# Patient Record
Sex: Female | Born: 1995
Health system: Southern US, Community
[De-identification: ages and names within clinical notes are randomized; demographics above are authoritative.]

## PROBLEM LIST (undated history)

## (undated) HISTORY — PX: TONSILLECTOMY: SUR1361

---
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
## 2018-02-03 ENCOUNTER — Encounter (HOSPITAL_BASED_OUTPATIENT_CLINIC_OR_DEPARTMENT_OTHER): Payer: Self-pay

## 2018-02-03 ENCOUNTER — Other Ambulatory Visit: Payer: Self-pay

## 2018-02-03 ENCOUNTER — Emergency Department (HOSPITAL_BASED_OUTPATIENT_CLINIC_OR_DEPARTMENT_OTHER)
Admission: EM | Admit: 2018-02-03 | Discharge: 2018-02-04 | Disposition: A | Discharge status: Home / Self Care | Payer: BLUE CROSS/BLUE SHIELD | Attending: Emergency Medicine | Admitting: Emergency Medicine

## 2018-02-03 DIAGNOSIS — R1012 Left upper quadrant pain: Secondary | ICD-10-CM | POA: Diagnosis present

## 2018-02-03 DIAGNOSIS — N200 Calculus of kidney: Secondary | ICD-10-CM | POA: Diagnosis not present

## 2018-02-03 LAB — PREGNANCY, URINE: Preg Test, Ur: NEGATIVE

## 2018-02-03 NOTE — ED Triage Notes (Signed)
Pt reports left sided back pain that began this AM and has now radiated to left side of abdomen. Pt denies urinary sx, fevers, N/V/D.

## 2018-02-04 ENCOUNTER — Other Ambulatory Visit: Payer: Self-pay

## 2018-02-04 LAB — URINALYSIS, ROUTINE W REFLEX MICROSCOPIC
Bilirubin Urine: NEGATIVE
Glucose, UA: NEGATIVE mg/dL
Ketones, ur: NEGATIVE mg/dL
Leukocytes, UA: NEGATIVE
Nitrite: NEGATIVE
Protein, ur: NEGATIVE mg/dL
Specific Gravity, Urine: 1.015 (ref 1.005–1.030)
pH: 7 (ref 5.0–8.0)

## 2018-02-04 LAB — URINALYSIS, MICROSCOPIC (REFLEX)

## 2018-02-04 MED ORDER — KETOROLAC TROMETHAMINE 15 MG/ML IJ SOLN
15.0000 mg | Freq: Once | INTRAMUSCULAR | Status: AC
  Administered 2018-02-04: 15 mg via INTRAMUSCULAR
  Filled 2018-02-04: qty 1

## 2018-02-04 MED ORDER — ONDANSETRON 4 MG PO TBDP: ORAL_TABLET | ORAL | 0 refills | Status: DC

## 2018-02-04 MED ORDER — MORPHINE SULFATE 15 MG PO TABS: 15.0000 mg | ORAL_TABLET | ORAL | 0 refills | Status: DC | PRN

## 2018-02-04 MED ORDER — TAMSULOSIN HCL 0.4 MG PO CAPS: 0.4000 mg | ORAL_CAPSULE | Freq: Every day | ORAL | 0 refills | Status: DC

## 2018-02-04 NOTE — ED Notes (Signed)
Patient is alert and orientedx4.  Patient was explained discharge instructions and they understood them with no questions.  The patient's mother, Victorino DikeJennifer is driving the patient home.

## 2018-02-04 NOTE — ED Provider Notes (Signed)
[facility] EMERGENCY DEPARTMENT Provider Note   CSN: [id number] Arrival date & time: 02/03/18  2335     History   Chief Complaint Chief Complaint  Patient presents with  . Abdominal Pain    HPI Jeanne Ellis is a 22 y.o. female.  22 yo F with a chief complaint of left flank pain.  This started this evening.  Was sharp and severe.  Nothing seemed to make it better or worse and then it spontaneously improved.  It then recurred a little bit lower down in her back and into the front.  She thought maybe it it improved with massage and Tylenol.  Denies fevers.  Pain suddenly got much worse and she had episode of vomiting and then brought her into the emergency department.  She denies any urinary symptoms denies vaginal bleeding or discharge.  Denies sick contacts.  She denies history of kidney stones but mom had a kidney stone in the past.  The history is provided by the patient.  Abdominal Pain   This is a new problem. The current episode started 3 to 5 hours ago. The problem occurs constantly. The problem has been gradually worsening. The pain is located in the LUQ. The quality of the pain is sharp and shooting. The pain is at a severity of 9/10. The pain is severe. Associated symptoms include nausea and vomiting. Pertinent negatives include fever, dysuria, frequency, headaches, arthralgias and myalgias. Nothing aggravates the symptoms. Nothing relieves the symptoms.    History reviewed. No pertinent past medical history.  There are no active problems to display for this patient.   Past Surgical History:  Procedure Laterality Date  . TONSILLECTOMY       OB History   None      Home Medications    Prior to Admission medications   Medication Sig Start Date End Date Taking? Authorizing Provider  morphine (MSIR) 15 MG tablet Take 1 tablet (15 mg total) by mouth every 4 (four) hours as needed for severe pain. 02/04/18   Melene PlanFloyd, Dyquan Minks, DO  ondansetron (ZOFRAN ODT) 4 MG  disintegrating tablet 4mg  ODT q4 hours prn nausea/vomit 02/04/18   Melene PlanFloyd, Zaeden Lastinger, DO  tamsulosin (FLOMAX) 0.4 MG CAPS capsule Take 1 capsule (0.4 mg total) by mouth daily after supper. 02/04/18   Melene PlanFloyd, Mael Delap, DO    Family History No family history on file.  Social History Social History   Tobacco Use  . Smoking status: Never Smoker  . Smokeless tobacco: Never Used  Substance Use Topics  . Alcohol use: Yes    Frequency: Never  . Drug use: Never     Allergies   Penicillins   Review of Systems Review of Systems  Constitutional: Negative for chills and fever.  HENT: Negative for congestion and rhinorrhea.   Eyes: Negative for redness and visual disturbance.  Respiratory: Negative for shortness of breath and wheezing.   Cardiovascular: Negative for chest pain and palpitations.  Gastrointestinal: Positive for abdominal pain, nausea and vomiting.  Genitourinary: Positive for flank pain. Negative for dysuria, frequency and urgency.  Musculoskeletal: Negative for arthralgias and myalgias.  Skin: Negative for pallor and wound.  Neurological: Negative for dizziness and headaches.     Physical Exam Updated Vital Signs BP (!) 160/100 (BP Location: Right Arm)   Temp 98.4 F (36.9 C) (Oral)   Resp 18   Ht 5\' 2"  (1.575 m)   Wt 63.5 kg (140 lb)   LMP 01/04/2018   SpO2 99%   BMI 25.61  kg/m   Physical Exam  Constitutional: She is oriented to person, place, and time. She appears well-developed and well-nourished. No distress.  HENT:  Head: Normocephalic and atraumatic.  Eyes: Pupils are equal, round, and reactive to light. EOM are normal.  Neck: Normal range of motion. Neck supple.  Cardiovascular: Normal rate and regular rhythm. Exam reveals no gallop and no friction rub.  No murmur heard. Pulmonary/Chest: Effort normal. She has no wheezes. She has no rales.  Abdominal: Soft. She exhibits no distension and no mass. There is no tenderness. There is no guarding, no CVA tenderness, no  tenderness at McBurney's point and negative Murphy's sign.  Musculoskeletal: She exhibits no edema or tenderness.  Neurological: She is alert and oriented to person, place, and time.  Skin: Skin is warm and dry. She is not diaphoretic.  Psychiatric: She has a normal mood and affect. Her behavior is normal.  Nursing note and vitals reviewed.    ED Treatments / Results  Labs (all labs ordered are listed, but only abnormal results are displayed) Labs Reviewed  URINALYSIS, ROUTINE W REFLEX MICROSCOPIC - Abnormal; Notable for the following components:      Result Value   Hgb urine dipstick MODERATE (*)    All other components within normal limits  URINALYSIS, MICROSCOPIC (REFLEX) - Abnormal; Notable for the following components:   Bacteria, UA MANY (*)    All other components within normal limits  PREGNANCY, URINE    EKG None  Radiology No results found.  Procedures Procedures (including critical care time)  Medications Ordered in ED Medications  ketorolac (TORADOL) 15 MG/ML injection 15 mg (15 mg Intramuscular Given 02/04/18 0018)     Initial Impression / Assessment and Plan / ED Course  I have reviewed the triage vital signs and the nursing notes.  Pertinent labs & imaging results that were available during my care of the patient were reviewed by me and considered in my medical decision making (see chart for details).     22 yo F 1q with a chief complaint of colicky flank pain.  By history this sounds like kidney stones.  Unfortunately I do not have ultrasound at this time.  I discussed risks and benefits of CT imaging with the family and patient currently elects for treatment as if this is a kidney stone.  Hematuria on urine dip. She will follow-up with her family physician.  2:41 AM:  I have discussed the diagnosis/risks/treatment options with the patient and family and believe the pt to be eligible for discharge home to follow-up with PCP. We also discussed returning to  the ED immediately if new or worsening sx occur. We discussed the sx which are most concerning (e.g., sudden worsening pain, fever, inability to tolerate by mouth) that necessitate immediate return. Medications administered to the patient during their visit and any new prescriptions provided to the patient are listed below.  Medications given during this visit Medications  ketorolac (TORADOL) 15 MG/ML injection 15 mg (15 mg Intramuscular Given 02/04/18 0018)      The patient appears reasonably screen and/or stabilized for discharge and I doubt any other medical condition or other [facility] requiring further screening, evaluation, or treatment in the ED at this time prior to discharge.    Final Clinical Impressions(s) / ED Diagnoses   Final diagnoses:  Nephrolithiasis    ED Discharge Orders        Ordered    morphine (MSIR) 15 MG tablet  Every 4 hours PRN  02/04/18 0016    ondansetron (ZOFRAN ODT) 4 MG disintegrating tablet     02/04/18 0016    tamsulosin (FLOMAX) 0.4 MG CAPS capsule  Daily after supper     02/04/18 0016       Melene Plan, DO 02/04/18 [id number]

## 2018-02-04 NOTE — ED Notes (Signed)
ED Provider at bedside. 

## 2018-02-04 NOTE — Discharge Instructions (Addendum)
Take 4 over the counter ibuprofen tablets 3 times a day or 2 over-the-counter naproxen tablets twice a day for pain. Also take tylenol 1000mg (2 extra strength) four times a day.   Then take the pain medicine if you feel like you need it. Narcotics do not help with the pain, they only make you care about it less.  You can become addicted to this, people may break into your house to steal it.  It will constipate you.  If you drive under the influence of this medicine you can get a DUI.    Please follow-up with your family physician.  If this does not pass any reasonable amount of time then he may need to see a urologist.  Please return to the emergency department for fever or uncontrolled pain or vomiting.

## 2020-11-06 ENCOUNTER — Other Ambulatory Visit: Payer: Self-pay

## 2020-11-06 ENCOUNTER — Emergency Department (HOSPITAL_COMMUNITY): Payer: BC Managed Care – PPO

## 2020-11-06 ENCOUNTER — Encounter (HOSPITAL_COMMUNITY): Payer: Self-pay

## 2020-11-06 ENCOUNTER — Emergency Department (HOSPITAL_COMMUNITY)
Admission: EM | Admit: 2020-11-06 | Discharge: 2020-11-06 | Disposition: A | Discharge status: Home / Self Care | Payer: BC Managed Care – PPO | Attending: Emergency Medicine | Admitting: Emergency Medicine

## 2020-11-06 DIAGNOSIS — R109 Unspecified abdominal pain: Secondary | ICD-10-CM | POA: Insufficient documentation

## 2020-11-06 DIAGNOSIS — M549 Dorsalgia, unspecified: Secondary | ICD-10-CM | POA: Diagnosis not present

## 2020-11-06 LAB — URINALYSIS, ROUTINE W REFLEX MICROSCOPIC
Bilirubin Urine: NEGATIVE
Glucose, UA: NEGATIVE mg/dL
Ketones, ur: NEGATIVE mg/dL
Nitrite: NEGATIVE
Protein, ur: NEGATIVE mg/dL
RBC / HPF: >50 RBC/hpf — ABNORMAL HIGH (ref 0–5)
Specific Gravity, Urine: 1.017 (ref 1.005–1.030)
pH: 5 (ref 5.0–8.0)

## 2020-11-06 LAB — COMPREHENSIVE METABOLIC PANEL
ALT: 14 U/L (ref 0–44)
AST: 20 U/L (ref 15–41)
Albumin: 3.6 g/dL (ref 3.5–5.0)
Alkaline Phosphatase: 58 U/L (ref 38–126)
Anion gap: 9 (ref 5–15)
BUN: 11 mg/dL (ref 6–20)
CO2: 19 mmol/L — ABNORMAL LOW (ref 22–32)
Calcium: 8.8 mg/dL — ABNORMAL LOW (ref 8.9–10.3)
Chloride: 109 mmol/L (ref 98–111)
Creatinine, Ser: 0.78 mg/dL (ref 0.44–1.00)
GFR, Estimated: >60 mL/min (ref >60)
Glucose, Bld: 112 mg/dL — ABNORMAL HIGH (ref 70–99)
Potassium: 3.8 mmol/L (ref 3.5–5.1)
Sodium: 137 mmol/L (ref 135–145)
Total Bilirubin: 0.2 mg/dL — ABNORMAL LOW (ref 0.3–1.2)
Total Protein: 7.5 g/dL (ref 6.5–8.1)

## 2020-11-06 LAB — CBC WITH DIFFERENTIAL/PLATELET
Abs Immature Granulocytes: 0.05 10*3/uL (ref 0.00–0.07)
Basophils Absolute: 0 10*3/uL (ref 0.0–0.1)
Basophils Relative: 0 %
Eosinophils Absolute: 0.1 10*3/uL (ref 0.0–0.5)
Eosinophils Relative: 1 %
HCT: 41.3 % (ref 36.0–46.0)
Hemoglobin: 13.5 g/dL (ref 12.0–15.0)
Immature Granulocytes: 0 %
Lymphocytes Relative: 16 %
Lymphs Abs: 2.1 10*3/uL (ref 0.7–4.0)
MCH: 27.4 pg (ref 26.0–34.0)
MCHC: 32.7 g/dL (ref 30.0–36.0)
MCV: 83.8 fL (ref 80.0–100.0)
Monocytes Absolute: 0.8 10*3/uL (ref 0.1–1.0)
Monocytes Relative: 6 %
Neutro Abs: 10 10*3/uL — ABNORMAL HIGH (ref 1.7–7.7)
Neutrophils Relative %: 77 %
Platelets: 335 10*3/uL (ref 150–400)
RBC: 4.93 MIL/uL (ref 3.87–5.11)
RDW: 13.5 % (ref 11.5–15.5)
WBC: 13.2 10*3/uL — ABNORMAL HIGH (ref 4.0–10.5)
nRBC: 0 % (ref 0.0–0.2)

## 2020-11-06 LAB — I-STAT BETA HCG BLOOD, ED (MC, WL, AP ONLY): I-stat hCG, quantitative: <5 m[IU]/mL (ref <5)

## 2020-11-06 LAB — LIPASE, BLOOD: Lipase: 45 U/L (ref 11–51)

## 2020-11-06 MED ORDER — HYDROCODONE-ACETAMINOPHEN 5-325 MG PO TABS
1.0000 | ORAL_TABLET | Freq: Once | ORAL | Status: AC
  Administered 2020-11-06: 1 via ORAL
  Filled 2020-11-06: qty 1

## 2020-11-06 MED ORDER — KETOROLAC TROMETHAMINE 60 MG/2ML IM SOLN
60.0000 mg | Freq: Once | INTRAMUSCULAR | Status: AC
  Administered 2020-11-06: 60 mg via INTRAMUSCULAR
  Filled 2020-11-06: qty 2

## 2020-11-06 NOTE — ED Provider Notes (Signed)
MSE was initiated and I personally evaluated the patient and placed orders (if any) at  6:21 AM on November 06, 2020.  Woke this morning due to right abdominal and flank pain. Initially had nausea that has resolved. No fever. Pain is right abdomen through to the flank.No history of same. No urinary symptoms, vaginal symptoms, cough, chest pain, or sob.   Today's Vitals   11/06/20 0608 11/06/20 0614  BP: 132/86   Pulse: 86   Resp: 18   Temp: 98.1 F (36.7 C)   TempSrc: Oral   SpO2: 100%   Weight: 72.6 kg   Height: 5\' 2"  (1.575 m)   PainSc:  8    Body mass index is 29.26 kg/m.   Tender RUQ and right flank.    The patient appears stable so that the remainder of the MSE may be completed by another provider.   , PA-C 11/06/20 01/06/21    [id number], MD 11/07/20 [phone]

## 2020-11-06 NOTE — ED Provider Notes (Signed)
[facility]-EMERGENCY DEPT Provider Note   CSN: [id number] Arrival date & time: 11/06/20  0601     History Chief Complaint  Patient presents with  . Flank Pain    Jeanne Freezeaylor Ellis is a 25 y.o. female.  HPI Patient is a 25 year old female who presents the emergency department due to abdominal pain and back pain.  She states she woke up about 2.5 hours ago with excruciating pain in the right abdomen and flank.  She reports mild nausea at the time which is since resolved.  No vomiting.  No fevers, chills, urinary complaints.  She does note a history of kidney stones in the past that were on the left side.  She was given a Vicodin in triage and states her current pain has mostly resolved and is now is 3/10.    History reviewed. No pertinent past medical history.  There are no problems to display for this patient.   Past Surgical History:  Procedure Laterality Date  . TONSILLECTOMY       OB History   No obstetric history on file.     No family history on file.  Social History   Tobacco Use  . Smoking status: Never Smoker  . Smokeless tobacco: Never Used  Substance Use Topics  . Alcohol use: Yes  . Drug use: Never    Home Medications Prior to Admission medications   Medication Sig Start Date End Date Taking? Authorizing Provider  cetirizine (ZYRTEC) 10 MG tablet Take 10 mg by mouth daily as needed for allergies.   Yes [provider]  JUNEL FE 1/20 1-20 MG-MCG tablet Take 1 tablet by mouth daily. 11/04/20  Yes [provider]  sertraline (ZOLOFT) 25 MG tablet Take 25 mg by mouth daily. Take with 50mg  tablet 08/29/20  Yes [provider]  sertraline (ZOLOFT) 50 MG tablet Take 50 mg by mouth daily. Take with 25mg  tablet 08/31/20  Yes [provider]    Allergies    Penicillins and Tape  Review of Systems   Review of Systems  All other systems reviewed and are negative. Ten systems reviewed and are negative for acute  change, except as noted in the HPI.   Physical Exam Updated Vital Signs BP 127/84   Pulse 71   Temp 98.1 F (36.7 C) (Oral)   Resp 16   Ht 5\' 2"  (1.575 m)   Wt 72.6 kg   SpO2 96%   BMI 29.26 kg/m   Physical Exam Vitals and nursing note reviewed.  Constitutional:      General: She is not in acute distress.    Appearance: Normal appearance. She is normal weight. She is not ill-appearing, toxic-appearing or diaphoretic.  HENT:     Head: Normocephalic and atraumatic.     Right Ear: External ear normal.     Left Ear: External ear normal.     Nose: Nose normal.     Mouth/Throat:     Mouth: Mucous membranes are moist.     Pharynx: Oropharynx is clear. No oropharyngeal exudate or posterior oropharyngeal erythema.  Eyes:     General: No scleral icterus.       Right eye: No discharge.        Left eye: No discharge.     Extraocular Movements: Extraocular movements intact.     Conjunctiva/sclera: Conjunctivae normal.  Cardiovascular:     Rate and Rhythm: Normal rate and regular rhythm.     Pulses: Normal pulses.  Heart sounds: Normal heart sounds. No murmur heard. No friction rub. No gallop.   Pulmonary:     Effort: Pulmonary effort is normal. No respiratory distress.     Breath sounds: Normal breath sounds. No stridor. No wheezing, rhonchi or rales.  Abdominal:     General: Abdomen is flat.     Palpations: Abdomen is soft.     Tenderness: There is abdominal tenderness. There is right CVA tenderness. There is no left CVA tenderness.     Comments: Mild tenderness noted with deep palpation to the right central lateral abdomen.  Additional moderate tenderness noted to the right flank.  No left flank pain.  Musculoskeletal:        General: Normal range of motion.     Cervical back: Normal range of motion and neck supple. No tenderness.  Skin:    General: Skin is warm and dry.  Neurological:     General: No focal deficit present.     Mental Status: She is alert and oriented to  person, place, and time.  Psychiatric:        Mood and Affect: Mood normal.        Behavior: Behavior normal.    ED Results / Procedures / Treatments   Labs (all labs ordered are listed, but only abnormal results are displayed) Labs Reviewed  URINALYSIS, ROUTINE W REFLEX MICROSCOPIC - Abnormal; Notable for the following components:      Result Value   Hgb urine dipstick LARGE (*)    Leukocytes,Ua TRACE (*)    RBC / HPF >50 (*)    Bacteria, UA FEW (*)    All other components within normal limits  CBC WITH DIFFERENTIAL/PLATELET - Abnormal; Notable for the following components:   WBC 13.2 (*)    Neutro Abs 10.0 (*)    All other components within normal limits  COMPREHENSIVE METABOLIC PANEL - Abnormal; Notable for the following components:   CO2 19 (*)    Glucose, Bld 112 (*)    Calcium 8.8 (*)    Total Bilirubin 0.2 (*)    All other components within normal limits  LIPASE, BLOOD  I-STAT BETA HCG BLOOD, ED ([facility], [facility], [facility] ONLY)    EKG None  Radiology US Abdomen Limited  Result Date: 11/06/2020 CLINICAL DATA:  Right upper quadrant pain, nausea EXAM: ULTRASOUND ABDOMEN LIMITED RIGHT UPPER QUADRANT COMPARISON:  None. FINDINGS: Gallbladder: No gallstones or wall thickening visualized. No sonographic Murphy sign noted by sonographer. Common bile duct: Diameter: Normal at 3 mm Liver: No focal lesion identified. Within normal limits in parenchymal echogenicity. Portal vein is patent on color Doppler imaging with normal direction of blood flow towards the liver. Other: None. IMPRESSION: Normal right upper quadrant ultrasound. Electronically Signed   By: Malachy Moan M.D.   On: 11/06/2020 07:13   CT Renal Stone Study  Result Date: 11/06/2020 CLINICAL DATA:  Right flank pain EXAM: CT ABDOMEN AND PELVIS WITHOUT CONTRAST TECHNIQUE: Multidetector CT imaging of the abdomen and pelvis was performed following the standard protocol without oral or IV contrast. COMPARISON:  None. FINDINGS: Lower  chest: There is slight bibasilar atelectasis. No lung base edema or consolidation. Hepatobiliary: No focal liver lesions are appreciable on this noncontrast enhanced study. Gallbladder wall is not appreciably thickened. There is no biliary duct dilatation. Pancreas: There is no pancreatic mass or inflammatory focus. Spleen: No splenic lesions are evident. There is a small splenule inferior to the spleen. Adrenals/Urinary Tract: Adrenals bilaterally appear normal. There is no  appreciable mass or hydronephrosis on either side. There is a 1 mm calculus in the lower pole the left kidney. 1 mm calculus noted in upper to mid right kidney with a tiny calculus in the lower pole the right kidney as well. There is no appreciable ureteral calculus on either side. Urinary bladder is midline with wall thickness within normal limits. Stomach/Bowel: There is no appreciable bowel wall or mesenteric thickening. There is moderate stool in the colon. No evident bowel obstruction. The terminal ileum appears normal. Note that the succumb is located in the inferior right pelvis. Appendix appears normal. No evident free air or portal venous air. Vascular/Lymphatic: No abdominal aortic aneurysm. No vascular lesions evident on noncontrast enhanced study. There is no evident adenopathy in the abdomen or pelvis. Reproductive: The uterus is anteverted.  No evident adnexal mass. Other: No ascites or abscess evident in the abdomen or pelvis. There is slight fat in the umbilicus. Musculoskeletal: There is a total hip replacement on the left. No blastic or lytic bone lesions. No intramuscular lesions. IMPRESSION: 1. 1 mm calculus in each kidney. No hydronephrosis or ureteral calculus on either side. Urinary bladder wall thickness normal. 2. No bowel wall thickening or bowel obstruction. No abscess in the abdomen pelvis. Appendix appears normal. 3.  Total hip prosthesis on the left. Electronically Signed   By: Bretta Bang III M.D.   On:  11/06/2020 09:15    Procedures Procedures   Medications Ordered in ED Medications  ketorolac (TORADOL) injection 60 mg (has no administration in time range)  HYDROcodone-acetaminophen (NORCO/VICODIN) 5-325 MG per tablet 1 tablet (1 tablet Oral Given 11/06/20 0254)    ED Course  I have reviewed the triage vital signs and the nursing notes.  Pertinent labs & imaging results that were available during my care of the patient were reviewed by me and considered in my medical decision making (see chart for details).    MDM Rules/Calculators/A&P                          Pt is a 25 y.o. female who presents to the ED with right abdominal pain and right flank pain.  Labs: CBC with a white count of 13.2 and neutrophils of 10. CMP with a CO2 of 19, glucose of 112, calcium of 8.8, total bilirubin of 0.2. Lipase of 45. I-STAT beta-hCG less than 5. UA with large hemoglobin, trace leukocytes, greater than 50 red blood cells, few bacteria.  Imaging: Right upper quadrant ultrasound is negative. CT scan of the abdomen and pelvis without contrast shows a 1 mm calculus in each kidney without hydronephrosis or ureteral calculus.  I, Placido Sou, PA-C, personally reviewed and evaluated these images and lab results as part of my medical decision-making.  Feel the patient likely passed a kidney stone this morning.  She has a history of kidney stones and UA showing greater than 50 red blood cells as well as large hemoglobin.  Imaging is reassuring.  She does have 1 mm stones in both kidneys but no stones noted in the bladder or ureters.  No bladder wall thickening.  Patient has no urinary complaints.  She was given 1 dose of Vicodin in triage and states her pain is almost completely resolved.  She is now lying comfortably in bed.   We will give patient a dose of IM Toradol here in the emergency department.  Recommended continued NSAID use.  Recommended that she continue monitoring her symptoms  and if  they worsen, she needs to return the emergency department immediately for reevaluation.  PCP follow-up.  She verbalized understanding of the above plan.  Her questions were answered and she was amicable to time of discharge.  Note: Portions of this report may have been transcribed using voice recognition software. Every effort was made to ensure accuracy; however, inadvertent computerized transcription errors may be present.   Final Clinical Impression(s) / ED Diagnoses Final diagnoses:  Flank pain   Rx / DC Orders ED Discharge Orders    None       Placido Sou, PA-C 11/06/20 9728    Cathren Laine, MD 11/06/20 1011

## 2020-11-06 NOTE — Discharge Instructions (Addendum)
Please continue taking ibuprofen for management of your pain.  You can take 400 to 600 mg 2-3 times per day.  Try to take this with a small amount of food to help prevent stomach irritation.  Please follow-up with your regular doctor regarding your symptoms.  If you have worsening pain, nausea, vomiting, fevers, chills, please return to the emergency department immediately for reevaluation.  It was a pleasure to meet you.

## 2022-04-26 IMAGING — US US ABDOMEN LIMITED RUQ/ASCITES
1 series · 14 of 25 positions shown · non-contrast
Comparison: None.

CLINICAL DATA: Right upper quadrant pain, nausea

EXAM:
ULTRASOUND ABDOMEN LIMITED RIGHT UPPER QUADRANT

[Series 1: us abdomen limited ruq/ascites · 14 of 86 slices shown]
[im 1/86]
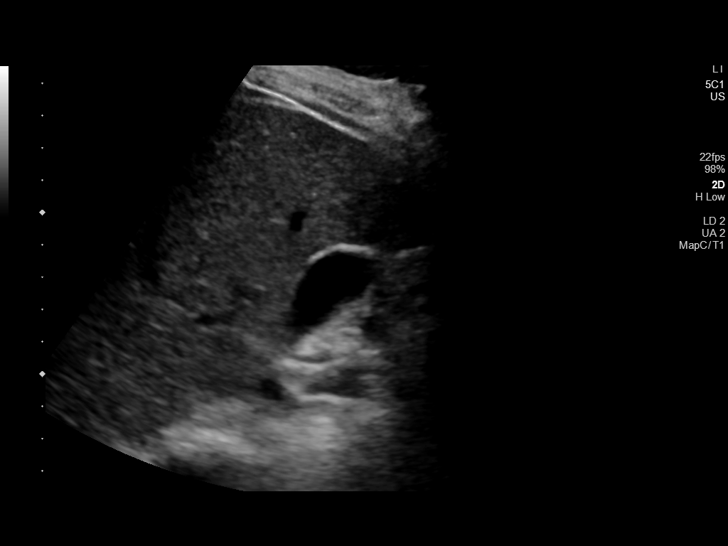
[im 8/86]
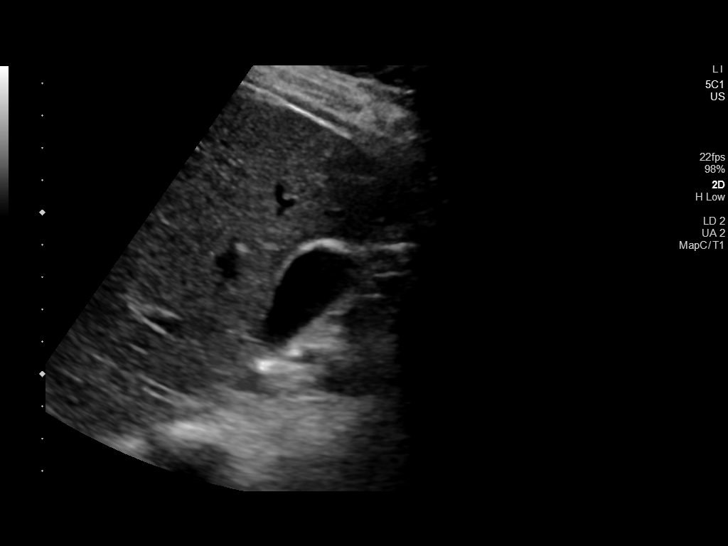
[im 15/86]
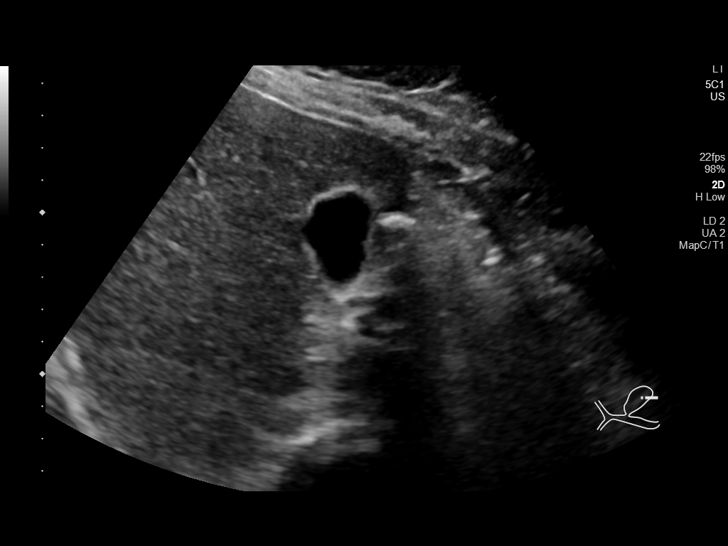
[im 22/86]
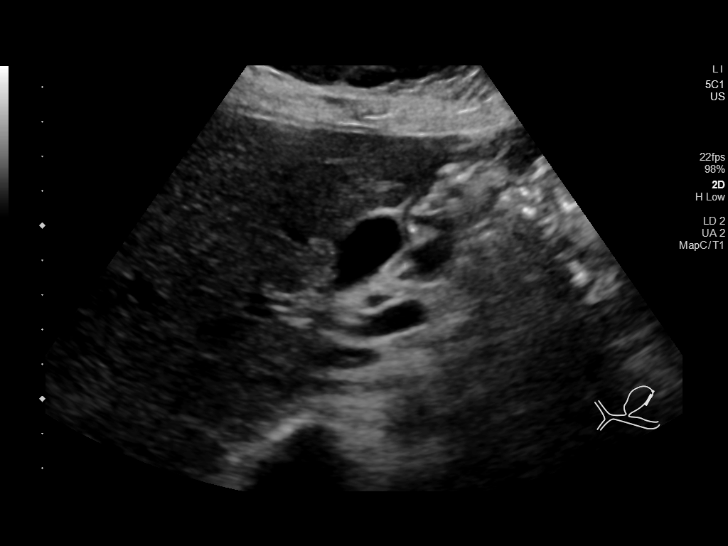
[im 29/86]
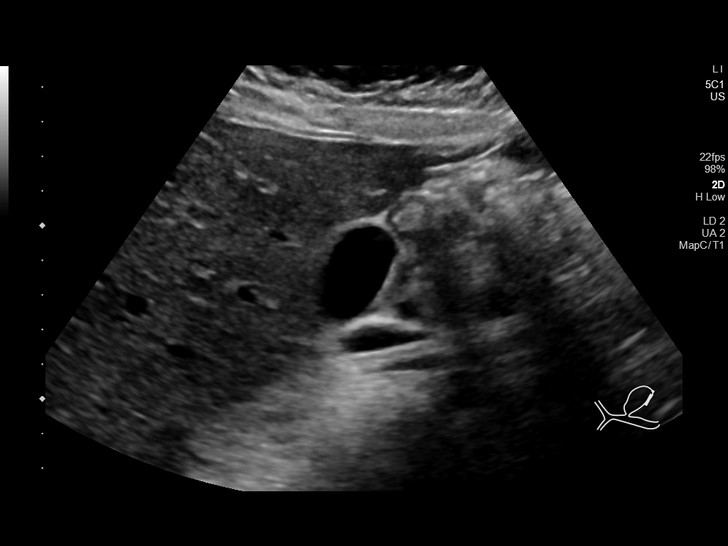
[im 32/86]
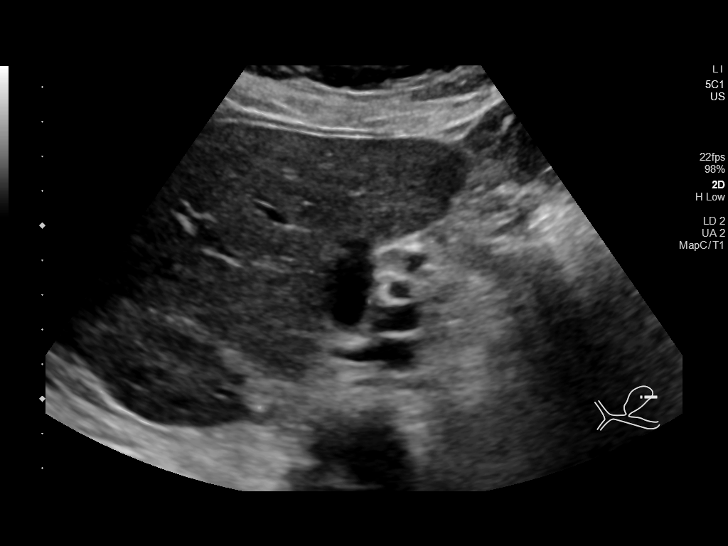
[im 39/86]
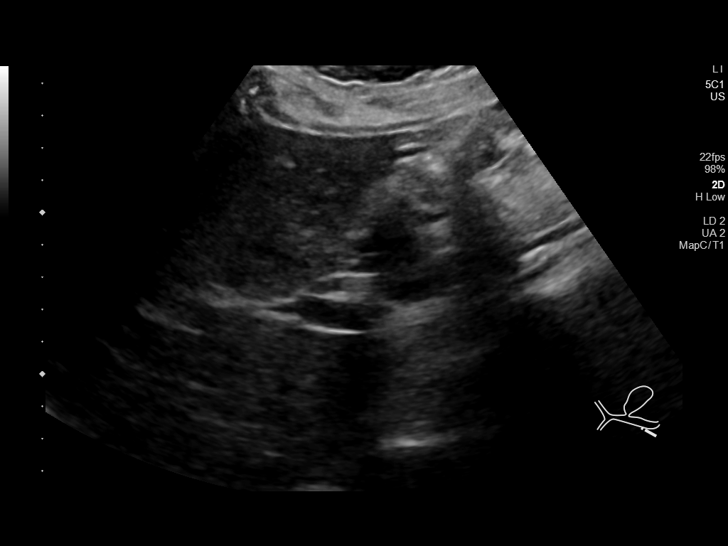
[im 47/86]
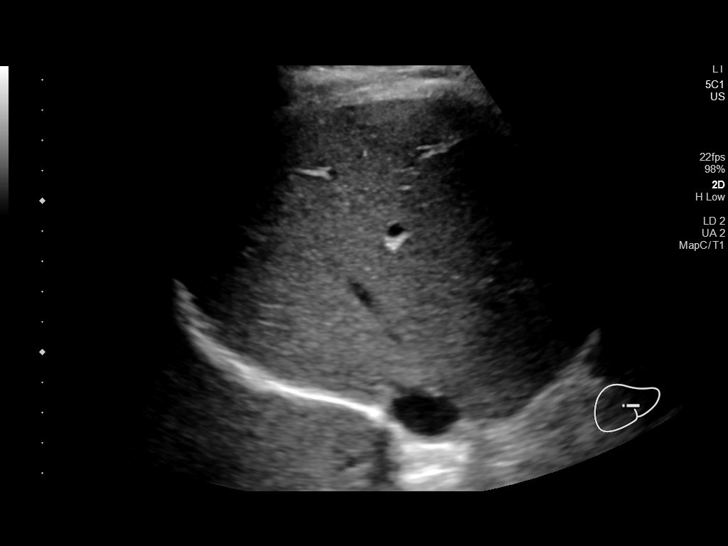
[im 54/86]
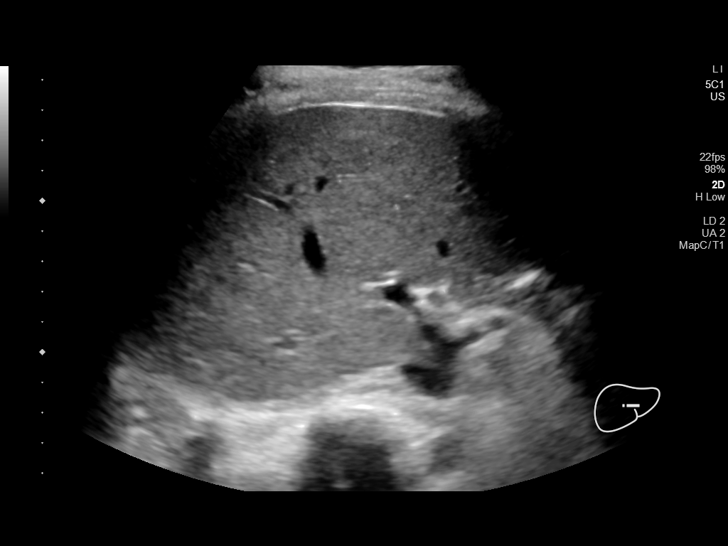
[im 57/86]
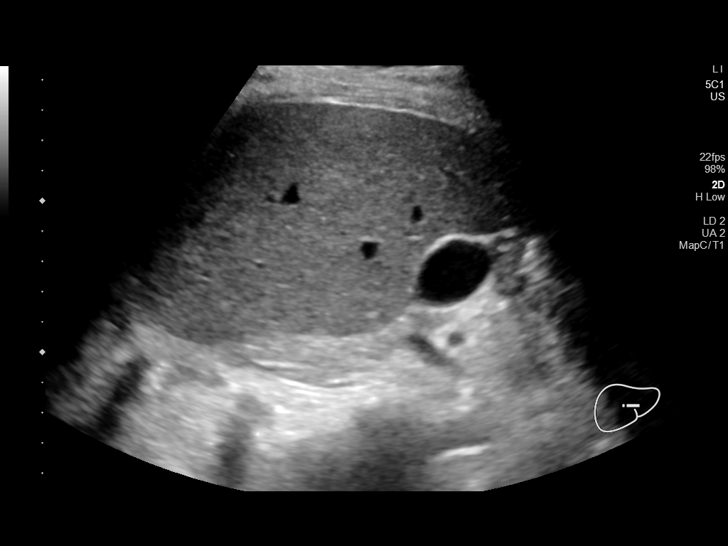
[im 64/86]
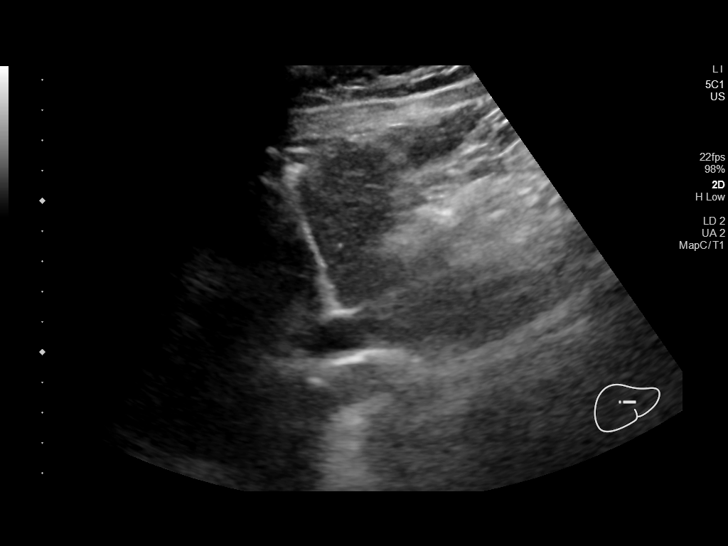
[im 71/86]
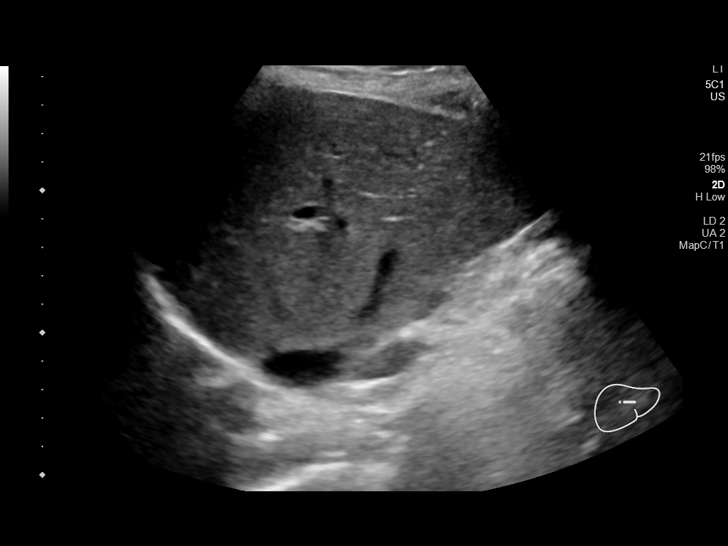
[im 78/86]
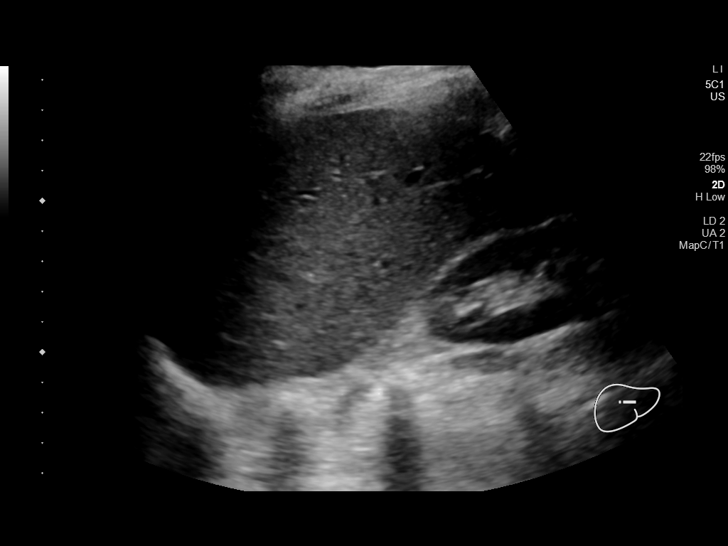
[im 86/86]
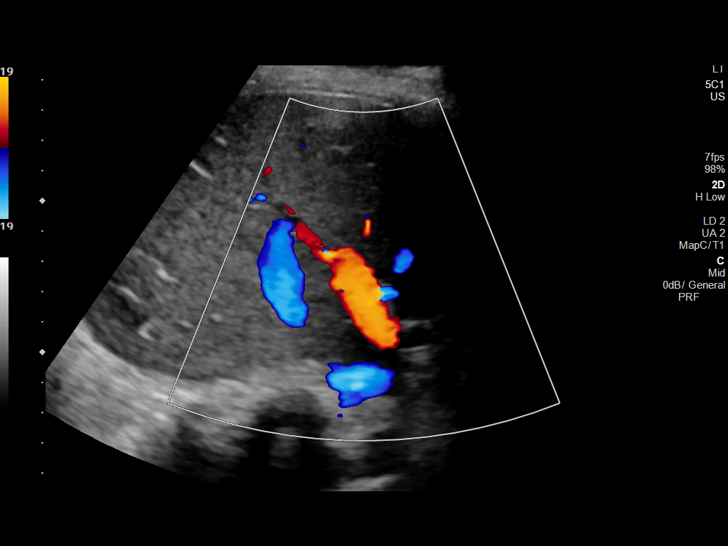

[14 of 25 positions shown; findings below may reference images not displayed]

FINDINGS: Gallbladder:

No gallstones or wall thickening visualized. No sonographic Murphy
sign noted by sonographer.

Common bile duct:

Diameter: Normal at 3 mm

Liver:

No focal lesion identified. Within normal limits in parenchymal
echogenicity. Portal vein is patent on color Doppler imaging with
normal direction of blood flow towards the liver.

Other: None.
IMPRESSION: Normal right upper quadrant ultrasound.

## 2022-04-26 IMAGING — CT CT RENAL STONE PROTOCOL
2 of 3 series · 16 of 46 positions shown, 18 images · non-contrast
Comparison: None.

CLINICAL DATA: Right flank pain

EXAM:
CT ABDOMEN AND PELVIS WITHOUT CONTRAST
TECHNIQUE: Multidetector CT imaging of the abdomen and pelvis was performed
following the standard protocol without oral or IV contrast.

[Series 3: lung bases · axial · 0.80mm/px · z∈[-152,-32]mm · 13 of 70 slices shown, 15 images]
[im 5/70  soft-tissue]
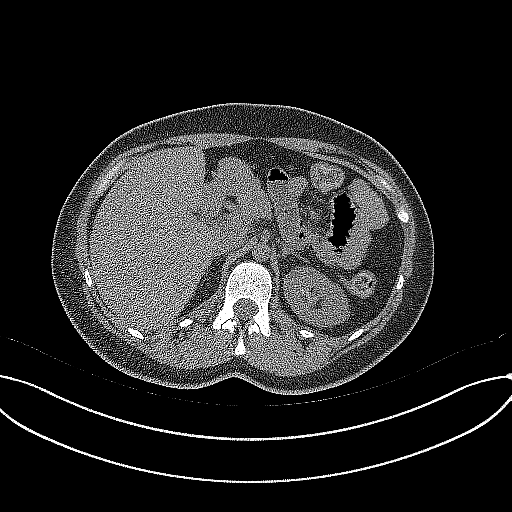
[im 5/70  bone]
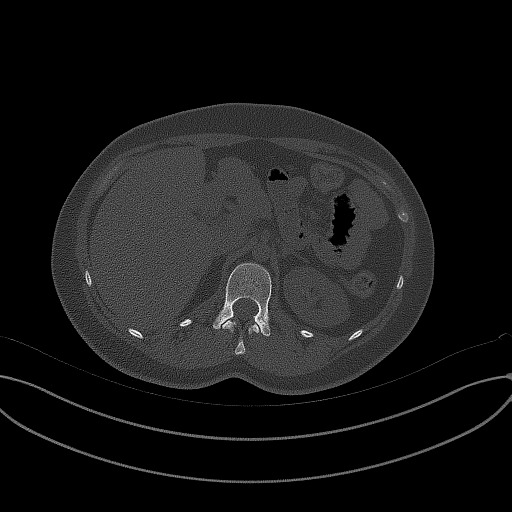
[im 9/70  soft-tissue]
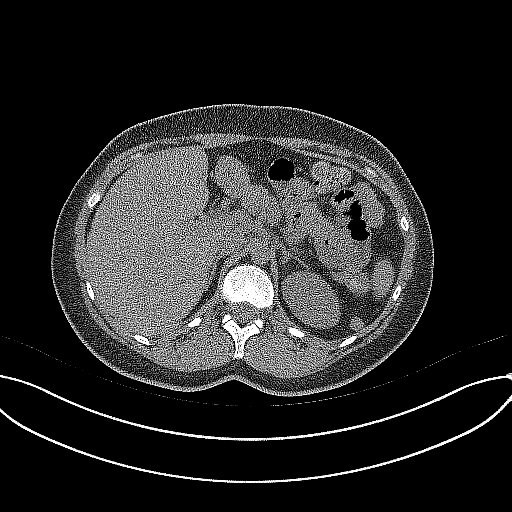
[im 14/70  soft-tissue]
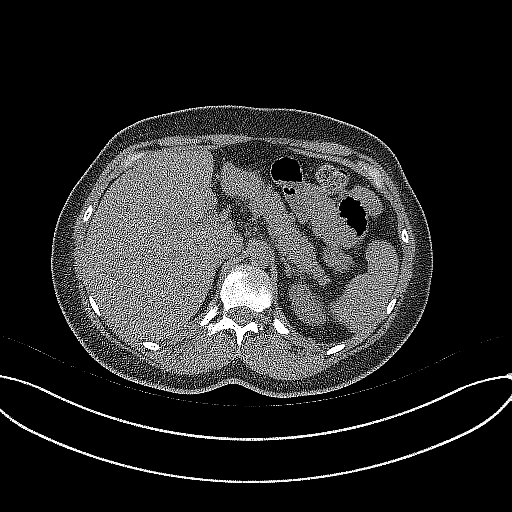
[im 21/70  soft-tissue]
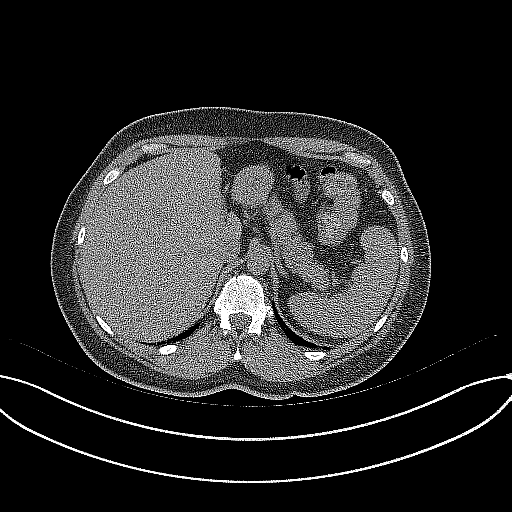
[im 25/70  soft-tissue]
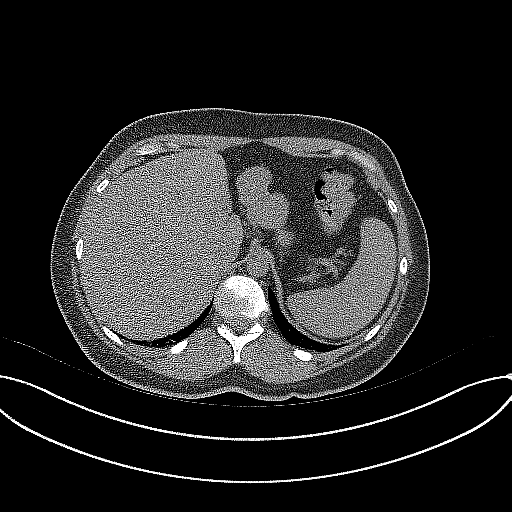
[im 29/70  soft-tissue]
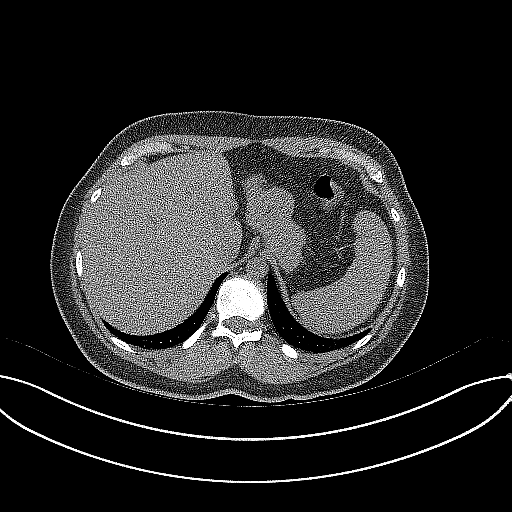
[im 36/70  soft-tissue]
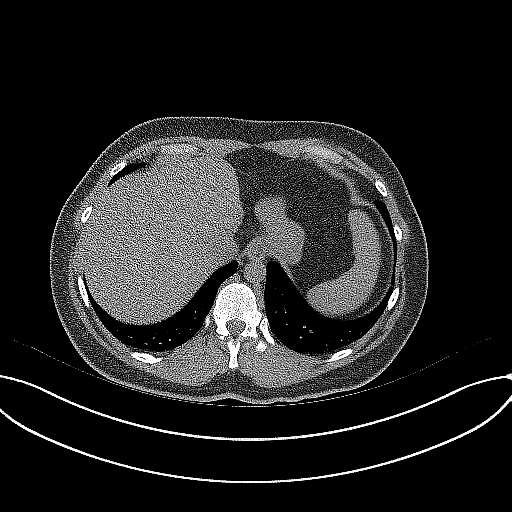
[im 41/70  soft-tissue]
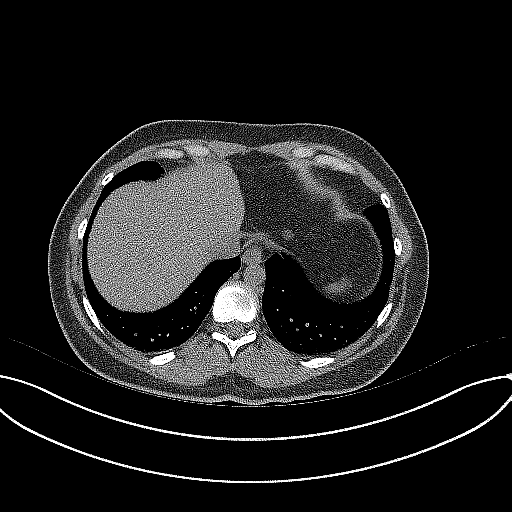
[im 45/70  soft-tissue]
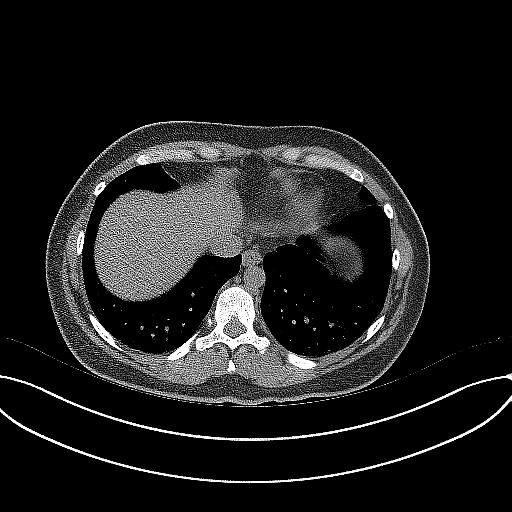
[im 45/70  bone]
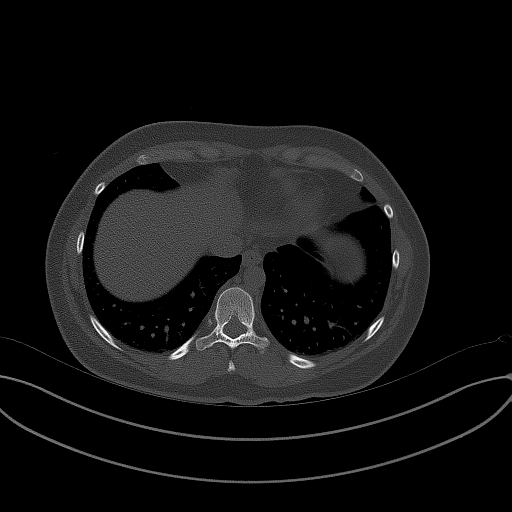
[im 49/70  soft-tissue]
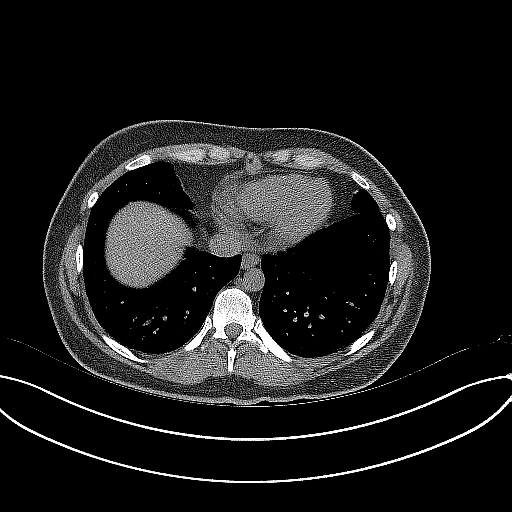
[im 56/70  soft-tissue]
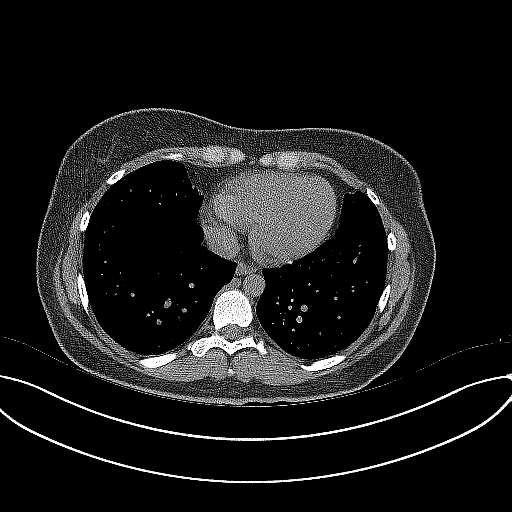
[im 61/70  soft-tissue]
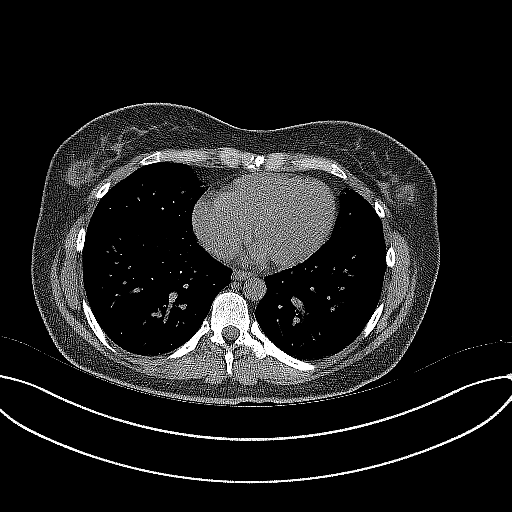
[im 65/70  soft-tissue]
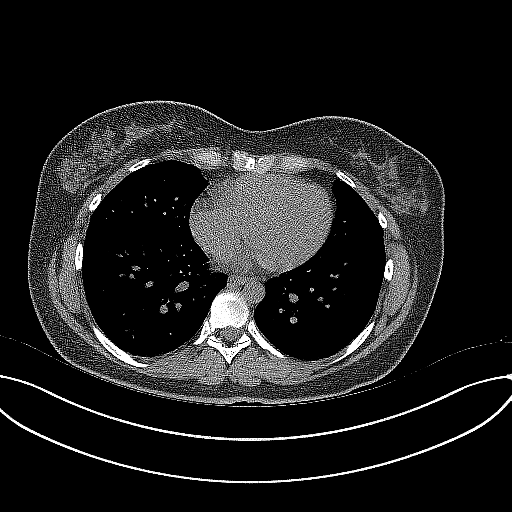

[Series 5: coronal · coronal · 0.71mm/px · 3 of 151 slices shown]
[im 51/151  soft-tissue]
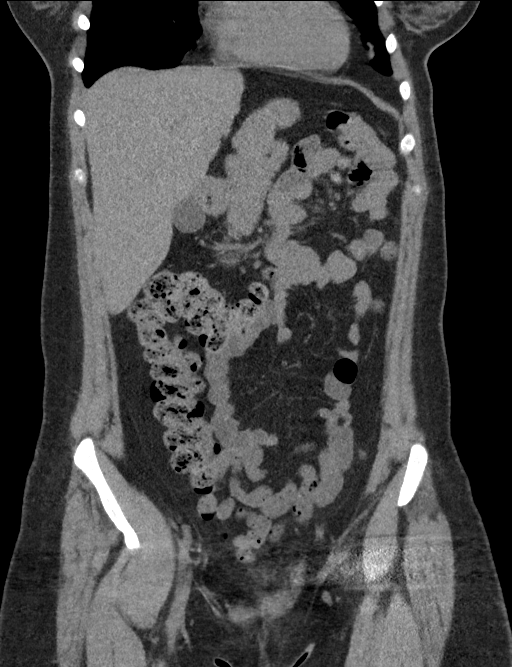
[im 67/151  soft-tissue]
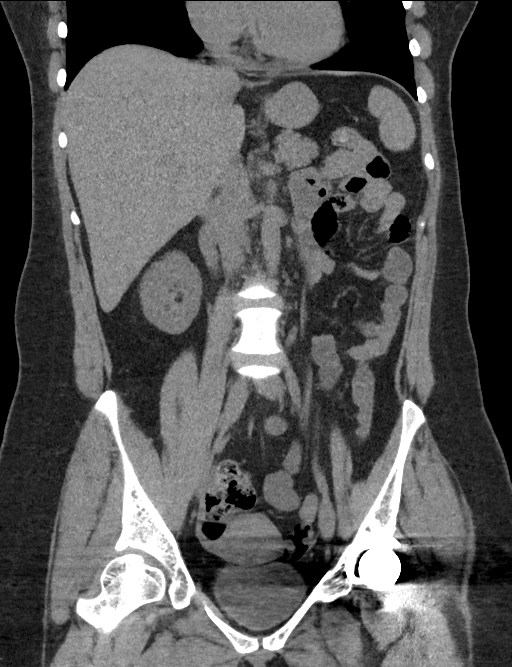
[im 84/151  soft-tissue]
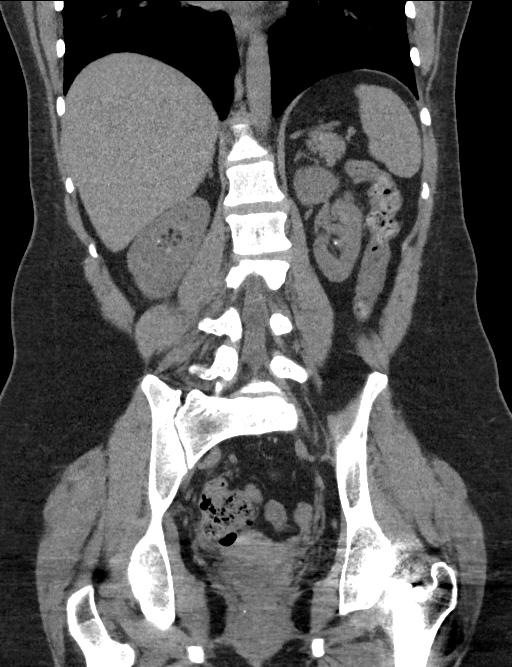

[16 of 46 positions shown; findings below may reference images not displayed]

FINDINGS: Lower chest: There is slight bibasilar atelectasis. No lung base
edema or consolidation.

Hepatobiliary: No focal liver lesions are appreciable on this
noncontrast enhanced study. Gallbladder wall is not appreciably
thickened. There is no biliary duct dilatation.

Pancreas: There is no pancreatic mass or inflammatory focus.

Spleen: No splenic lesions are evident. There is a small splenule
inferior to the spleen.

Adrenals/Urinary Tract: Adrenals bilaterally appear normal. There is
no appreciable mass or hydronephrosis on either side. There is a 1
mm calculus in the lower pole the left kidney. 1 mm calculus noted
in upper to mid right kidney with a tiny calculus in the lower pole
the right kidney as well. There is no appreciable ureteral calculus
on either side. Urinary bladder is midline with wall thickness
within normal limits.

Stomach/Bowel: There is no appreciable bowel wall or mesenteric
thickening. There is moderate stool in the colon. No evident bowel
obstruction. The terminal ileum appears normal. Note that the
succumb is located in the inferior right pelvis. Appendix appears
normal. No evident free air or portal venous air.

Vascular/Lymphatic: No abdominal aortic aneurysm. No vascular
lesions evident on noncontrast enhanced study. There is no evident
adenopathy in the abdomen or pelvis.

Reproductive: The uterus is anteverted.  No evident adnexal mass.

Other: No ascites or abscess evident in the abdomen or pelvis. There
is slight fat in the umbilicus.

Musculoskeletal: There is a total hip replacement on the left. No
blastic or lytic bone lesions. No intramuscular lesions.
IMPRESSION: 1. 1 mm calculus in each kidney. No hydronephrosis or ureteral
calculus on either side. Urinary bladder wall thickness normal.

2. No bowel wall thickening or bowel obstruction. No abscess in the
abdomen pelvis. Appendix appears normal.

3.  Total hip prosthesis on the left.
